# Patient Record
Sex: Male | Born: 1985
Health system: Southern US, Community
[De-identification: ages and names within clinical notes are randomized; demographics above are authoritative.]

## PROBLEM LIST (undated history)

## (undated) HISTORY — PX: HAND SURGERY: SHX662

---
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
## 2010-02-04 ENCOUNTER — Emergency Department (HOSPITAL_COMMUNITY): Admission: AC | Admit: 2010-02-04 | Discharge: 2010-02-04 | Payer: Self-pay | Admitting: Emergency Medicine

## 2010-02-15 ENCOUNTER — Emergency Department (HOSPITAL_COMMUNITY): Admission: EM | Admit: 2010-02-15 | Discharge: 2010-02-15 | Payer: Self-pay | Admitting: Emergency Medicine

## 2011-07-29 ENCOUNTER — Ambulatory Visit: Payer: Self-pay | Admitting: Family Medicine

## 2013-02-25 ENCOUNTER — Encounter (HOSPITAL_COMMUNITY): Payer: Self-pay

## 2013-02-25 ENCOUNTER — Emergency Department (HOSPITAL_COMMUNITY): Payer: Worker's Compensation

## 2013-02-25 ENCOUNTER — Emergency Department (HOSPITAL_COMMUNITY)
Admission: EM | Admit: 2013-02-25 | Discharge: 2013-02-25 | Disposition: A | Discharge status: Home / Self Care | Payer: Worker's Compensation | Attending: Emergency Medicine | Admitting: Emergency Medicine

## 2013-02-25 DIAGNOSIS — S0083XA Contusion of other part of head, initial encounter: Secondary | ICD-10-CM | POA: Insufficient documentation

## 2013-02-25 DIAGNOSIS — S0003XA Contusion of scalp, initial encounter: Secondary | ICD-10-CM | POA: Insufficient documentation

## 2013-02-25 DIAGNOSIS — H538 Other visual disturbances: Secondary | ICD-10-CM | POA: Insufficient documentation

## 2013-02-25 DIAGNOSIS — S0993XA Unspecified injury of face, initial encounter: Secondary | ICD-10-CM | POA: Insufficient documentation

## 2013-02-25 DIAGNOSIS — S0530XA Ocular laceration without prolapse or loss of intraocular tissue, unspecified eye, initial encounter: Secondary | ICD-10-CM | POA: Insufficient documentation

## 2013-02-25 MED ORDER — ACETAMINOPHEN 325 MG PO TABS: 650.0000 mg | ORAL_TABLET | Freq: Four times a day (QID) | ORAL | Status: AC | PRN
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
## 2013-02-27 NOTE — ED Provider Notes (Signed)
Medical screening examination/treatment/procedure(s) were performed by non-physician practitioner and as supervising physician I was immediately available for consultation/collaboration.   Enid Skeens, MD 02/27/13 [phone]
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
## 2015-10-27 ENCOUNTER — Emergency Department
Admission: EM | Admit: 2015-10-27 | Discharge: 2015-10-27 | Disposition: A | Discharge status: Home / Self Care | Payer: 59 | Attending: Emergency Medicine | Admitting: Emergency Medicine

## 2015-10-27 DIAGNOSIS — A084 Viral intestinal infection, unspecified: Secondary | ICD-10-CM | POA: Insufficient documentation

## 2015-10-27 DIAGNOSIS — R112 Nausea with vomiting, unspecified: Secondary | ICD-10-CM | POA: Diagnosis present

## 2015-10-27 LAB — COMPREHENSIVE METABOLIC PANEL
ALT: 26 U/L (ref 17–63)
ANION GAP: 9 (ref 5–15)
AST: 27 U/L (ref 15–41)
Albumin: 4.4 g/dL (ref 3.5–5.0)
Alkaline Phosphatase: 52 U/L (ref 38–126)
BUN: 20 mg/dL (ref 6–20)
CHLORIDE: 104 mmol/L (ref 101–111)
CO2: 25 mmol/L (ref 22–32)
Calcium: 9.2 mg/dL (ref 8.9–10.3)
Creatinine, Ser: 1.09 mg/dL (ref 0.61–1.24)
GFR calc Af Amer: >60 mL/min (ref >60)
Glucose, Bld: 154 mg/dL — ABNORMAL HIGH (ref 65–99)
POTASSIUM: 3.9 mmol/L (ref 3.5–5.1)
Sodium: 138 mmol/L (ref 135–145)
TOTAL PROTEIN: 7.7 g/dL (ref 6.5–8.1)
Total Bilirubin: 1.3 mg/dL — ABNORMAL HIGH (ref 0.3–1.2)

## 2015-10-27 LAB — CBC
HCT: 47.3 % (ref 40.0–52.0)
Hemoglobin: 16.7 g/dL (ref 13.0–18.0)
MCH: 28.6 pg (ref 26.0–34.0)
MCHC: 35.2 g/dL (ref 32.0–36.0)
MCV: 81.2 fL (ref 80.0–100.0)
PLATELETS: 189 10*3/uL (ref 150–440)
RBC: 5.82 MIL/uL (ref 4.40–5.90)
RDW: 12.9 % (ref 11.5–14.5)
WBC: 10.5 10*3/uL (ref 3.8–10.6)

## 2015-10-27 LAB — LIPASE, BLOOD: LIPASE: 19 U/L (ref 11–51)

## 2015-10-27 MED ORDER — LOPERAMIDE HCL 2 MG PO CAPS
4.0000 mg | ORAL_CAPSULE | Freq: Once | ORAL | Status: AC
  Administered 2015-10-27: 4 mg via ORAL
  Filled 2015-10-27: qty 2

## 2015-10-27 MED ORDER — SODIUM CHLORIDE 0.9 % IV BOLUS (SEPSIS)
1000.0000 mL | Freq: Once | INTRAVENOUS | Status: AC
  Administered 2015-10-27: 1000 mL via INTRAVENOUS

## 2015-10-27 MED ORDER — ONDANSETRON HCL 4 MG/2ML IJ SOLN
4.0000 mg | Freq: Once | INTRAMUSCULAR | Status: AC
  Administered 2015-10-27: 4 mg via INTRAVENOUS
  Filled 2015-10-27: qty 2

## 2015-10-27 MED ORDER — ONDANSETRON 4 MG PO TBDP: 4.0000 mg | ORAL_TABLET | Freq: Three times a day (TID) | ORAL | Status: DC | PRN

## 2015-10-27 NOTE — ED Provider Notes (Signed)
[facility] Emergency Department Provider Note  ____________________________________________  Time seen: 3:15 AM  I have reviewed the triage vital signs and the nursing notes.   HISTORY  Chief Complaint Emesis     HPI Stuart Anderson is a 30 y.o. male presents with history of acute onset of nausea vomiting and diarrhea associated with abdominal cramping at approximately 7 PM last night. Patient states that his wife has the same symptoms. Patient noted to be tachycardic on presentation with a heart rate of 139 and afebrile temperature of 98. Patient admits to copious episodes of nonbloody vomiting and diarrhea. Current discomfort is described as moderate     Past medical history None There are no active problems to display for this patient.   Past Surgical History  Procedure Laterality Date  . Hand surgery      Current Outpatient Rx  Name  Route  Sig  Dispense  Refill  . acetaminophen (TYLENOL) 325 MG tablet   Oral   Take 2 tablets (650 mg total) by mouth every 6 (six) hours as needed for pain.   15 tablet   0   . aspirin-acetaminophen-caffeine (EXCEDRIN MIGRAINE) 250-250-65 MG per tablet   Oral   Take 1 tablet by mouth every 6 (six) hours as needed for pain.           Allergies Review of patient's allergies indicates no known allergies.  No family history on file.  Social History Social History  Substance Use Topics  . Smoking status: Never Smoker   . Smokeless tobacco: Current User    Types: Chew  . Alcohol Use: Yes     Comment: occasinally    Review of Systems  Constitutional: Negative for fever. Eyes: Negative for visual changes. ENT: Negative for sore throat. Cardiovascular: Negative for chest pain. Respiratory: Negative for shortness of breath. Gastrointestinal: Positive for vomiting and diarrhea Genitourinary: Negative for dysuria. Musculoskeletal: Negative for back pain. Skin: Negative for rash. Neurological:  Negative for headaches, focal weakness or numbness.   10-point ROS otherwise negative.  ____________________________________________   PHYSICAL EXAM:  VITAL SIGNS: ED Triage Vitals  Enc Vitals Group     BP 10/27/15 0252 91/63 mmHg     Pulse Rate 10/27/15 0252 139     Resp 10/27/15 0252 16     Temp 10/27/15 0252 98 F (36.7 C)     Temp Source 10/27/15 0252 Oral     SpO2 10/27/15 0252 95 %     Weight 10/27/15 0252 220 lb (99.791 kg)     Height 10/27/15 0252  (1.854 m)     Head Cir --      Peak Flow --      Pain Score 10/27/15 0255 10     Pain Loc --      Pain Edu? --      Excl. in GC? --      Constitutional: Alert and oriented. Well appearing and in no distress. Eyes: Conjunctivae are normal. PERRL. Normal extraocular movements. ENT   Head: Normocephalic and atraumatic.   Nose: No congestion/rhinnorhea.   Mouth/Throat: Dry oral mucosa   Neck: No stridor. Hematological/Lymphatic/Immunilogical: No cervical lymphadenopathy. Cardiovascular: Normal rate, regular rhythm. Normal and symmetric distal pulses are present in all extremities. No murmurs, rubs, or gallops. Respiratory: Normal respiratory effort without tachypnea nor retractions. Breath sounds are clear and equal bilaterally. No wheezes/rales/rhonchi. Gastrointestinal: Soft and nontender. No distention. There is no CVA tenderness. Genitourinary: deferred Musculoskeletal: Nontender with normal range of motion  in all extremities. No joint effusions.  No lower extremity tenderness nor edema. Neurologic:  Normal speech and language. No gross focal neurologic deficits are appreciated. Speech is normal.  Skin:  Skin is warm, dry and intact. No rash noted. Psychiatric: Mood and affect are normal. Speech and behavior are normal. Patient exhibits appropriate insight and judgment.  ____________________________________________    LABS (pertinent positives/negatives)  Labs Reviewed  COMPREHENSIVE METABOLIC  PANEL - Abnormal; Notable for the following:    Glucose, Bld 154 (*)    Total Bilirubin 1.3 (*)    All other components within normal limits  LIPASE, BLOOD  CBC      INITIAL IMPRESSION / ASSESSMENT AND PLAN / ED COURSE  Pertinent labs & imaging results that were available during my care of the patient were reviewed by me and considered in my medical decision making (see chart for details).  Patient received 2 L IV normal saline, Zofran 4 mg IV as well as Imodium 4 mg by mouth with no further vomiting or diarrhea in the emergency department.  ____________________________________________   FINAL CLINICAL IMPRESSION(S) / ED DIAGNOSES  Final diagnoses:  Viral gastroenteritis      Darci Currentandolph N Amora Sheehy, MD 10/27/15 [phone]

## 2015-10-27 NOTE — Discharge Instructions (Signed)

## 2015-10-27 NOTE — ED Notes (Signed)
Pt in with co n.v.d and abd pain since yest  

## 2016-12-23 DIAGNOSIS — J301 Allergic rhinitis due to pollen: Secondary | ICD-10-CM | POA: Diagnosis not present

## 2017-03-23 DIAGNOSIS — M545 Low back pain: Secondary | ICD-10-CM | POA: Diagnosis not present

## 2017-10-03 DIAGNOSIS — J0100 Acute maxillary sinusitis, unspecified: Secondary | ICD-10-CM | POA: Diagnosis not present

## 2017-12-19 DIAGNOSIS — Z0000 Encounter for general adult medical examination without abnormal findings: Secondary | ICD-10-CM | POA: Diagnosis not present

## 2017-12-19 DIAGNOSIS — Z131 Encounter for screening for diabetes mellitus: Secondary | ICD-10-CM | POA: Diagnosis not present

## 2017-12-19 DIAGNOSIS — Z13220 Encounter for screening for lipoid disorders: Secondary | ICD-10-CM | POA: Diagnosis not present

## 2018-04-18 DIAGNOSIS — J069 Acute upper respiratory infection, unspecified: Secondary | ICD-10-CM | POA: Diagnosis not present

## 2018-04-18 DIAGNOSIS — J029 Acute pharyngitis, unspecified: Secondary | ICD-10-CM | POA: Diagnosis not present

## 2018-06-20 DIAGNOSIS — Z23 Encounter for immunization: Secondary | ICD-10-CM | POA: Diagnosis not present

## 2019-08-30 ENCOUNTER — Other Ambulatory Visit: Payer: 59

## 2023-01-19 ENCOUNTER — Other Ambulatory Visit: Payer: Self-pay

## 2023-01-19 ENCOUNTER — Ambulatory Visit (INDEPENDENT_AMBULATORY_CARE_PROVIDER_SITE_OTHER): Payer: No Typology Code available for payment source | Admitting: Allergy

## 2023-01-19 ENCOUNTER — Encounter: Payer: Self-pay | Admitting: Allergy

## 2023-01-19 VITALS — BP 110/70 | HR 81 | Temp 98.2°F | Ht 73.0 in | Wt 246.9 lb

## 2023-01-19 DIAGNOSIS — J302 Other seasonal allergic rhinitis: Secondary | ICD-10-CM

## 2023-01-19 DIAGNOSIS — J3089 Other allergic rhinitis: Secondary | ICD-10-CM | POA: Diagnosis not present

## 2023-01-19 MED ORDER — FEXOFENADINE HCL 180 MG PO TABS: 180.0000 mg | ORAL_TABLET | Freq: Every day | ORAL | 5 refills | Status: DC

## 2023-01-19 MED ORDER — FLUTICASONE PROPIONATE 50 MCG/ACT NA SUSP: 2.0000 | Freq: Every day | NASAL | 5 refills | Status: DC

## 2023-01-19 NOTE — Patient Instructions (Signed)
-   Testing today showed: grasses, weeds, trees, indoor molds, outdoor molds, dust mites, dog, and tobacco leaf - Copy of test results provided.  - Avoidance measures provided. - Stop taking: Zyrtec - Continue with: Flonase (fluticasone) 2 spray per nostril daily (AIM FOR EAR ON EACH SIDE) for 1-2 weeks at a time before stopping once nasal congestion improves for maximum benefit - Start taking: Allegra (fexofenadine) 180mg  tablet once daily - For the next 3-5 days use nasal Afrin 2 sprays 1-2 times a day.  Wait several minutes then when you can breathe more freely use your Flonase.  After 3-5 days the Flonase should be able to continue congestion control and stop use of Afrin.   - You can use an extra dose of the antihistamine, if needed, for breakthrough symptoms.  - Consider allergy shots as a means of long-term control. - Allergy shots "re-train" and "reset" the immune system to ignore environmental allergens and decrease the resulting immune response to those allergens (sneezing, itchy watery eyes, runny nose, nasal congestion, etc).    - Allergy shots improve symptoms in 75-85% of patients.   - There's 2 ways to start allergy shots traditional weekly injections for build-up vs RUSH protocol for build-up.  If interested in allergy shots you can let us know which start option you prefer.   Follow-up in 4-6 months or sooner if needed

## 2023-01-19 NOTE — Progress Notes (Signed)
New Patient Note  RE: Stuart Anderson MRN: [id number] DOB: 03-26-1986 Date of Office Visit: 01/19/2023  Primary care provider: Orvilla Cornwall, MD  Chief Complaint: allergies, congestion  History of present illness: Stuart Anderson is a 37 y.o. male presenting today for evaluation of allergic rhinitis.  He reports having congestion all the time.  When symptoms are bad can have drainage throat clearing.  Occasional itchy/watery eyes if he has been outside.   He can have sinus pressure/pain.   He has tried use of nasal saline flush but doesn't help him much.  He takes zyrtec daily for years and does help as it "tames" the symptoms.  He uses flonase at night to help him breathe better at night.  He states pollen season flares symptoms.   He does have a deviated septum and has seen an ENT in the past who recommended repair.   He has been to an allergist before several years ago and believes has had blood testing for allergens.  He states pollens were positive on testing that he recalls. No history of asthma, eczema or food allergy.  Review of systems: Review of Systems  Constitutional: Negative.   HENT:  Positive for congestion.   Eyes: Negative.   Respiratory: Negative.    Cardiovascular: Negative.   Musculoskeletal: Negative.   Skin: Negative.   Allergic/Immunologic: Negative.   Neurological: Negative.     All other systems negative unless noted above in HPI  Past medical history: History reviewed. No pertinent past medical history.  Past surgical history: Past Surgical History:  Procedure Laterality Date   HAND SURGERY      Family history:  Family History  Problem Relation Age of Onset   Allergic rhinitis Mother    Allergic rhinitis Father    Allergic rhinitis Brother     Social history: Lives in a home with out carpeting with electric heating and central cooling. Dog in the home. There is no concern for water damage, mildew or roaches in the home. He is a Veterinary surgeon.  He has no smoking history.    Medication List: Current Outpatient Medications  Medication Sig Dispense Refill   acetaminophen (TYLENOL) 325 MG tablet Take 2 tablets (650 mg total) by mouth every 6 (six) hours as needed for pain. 15 tablet 0   aspirin-acetaminophen-caffeine (EXCEDRIN MIGRAINE) 250-250-65 MG per tablet Take 1 tablet by mouth every 6 (six) hours as needed for pain.     atorvastatin (LIPITOR) 20 MG tablet Take by mouth.     cetirizine (ZYRTEC) 10 MG tablet Take 1 tablet by mouth daily.     escitalopram (LEXAPRO) 20 MG tablet Take 20 mg by mouth daily.     escitalopram (LEXAPRO) 20 MG tablet Take by mouth.     fexofenadine (ALLEGRA ALLERGY) 180 MG tablet Take 1 tablet (180 mg total) by mouth daily. 30 tablet 5   hydrOXYzine (ATARAX) 25 MG tablet Take by mouth.     ondansetron (ZOFRAN ODT) 4 MG disintegrating tablet Take 1 tablet (4 mg total) by mouth every 8 (eight) hours as needed for nausea or vomiting. 20 tablet 0   fluticasone (FLONASE) 50 MCG/ACT nasal spray Place 2 sprays into both nostrils daily. 16 g 5   No current facility-administered medications for this visit.    Known medication allergies: No Known Allergies   Physical examination: Blood pressure 110/70, pulse 81, temperature 98.2 F (36.8 C), height 6\' 1"  (1.854 m), weight 246 lb 14.4 oz (112 kg), SpO2 96 %.  General: Alert, interactive, in no acute distress. HEENT: PERRLA, TMs pearly gray, turbinates markedly edematous with clear discharge, post-pharynx non erythematous. Neck: Supple without lymphadenopathy. Lungs: Clear to auscultation without wheezing, rhonchi or rales. {no increased work of breathing. CV: Normal S1, S2 without murmurs. Abdomen: Nondistended, nontender. Skin: Warm and dry, without lesions or rashes. Extremities:  No clubbing, cyanosis or edema. Neuro:   Grossly intact.  Diagnositics/Labs:  Allergy testing:   Airborne Adult Perc - 01/19/23 0900     Time Antigen Placed 1610     Allergen Manufacturer Waynette Buttery    Location Back    Number of Test 55    1. Control-Buffer 50% Glycerol Negative    2. Control-Histamine 2+    3. Bahia 2+    4. [place] 2+    5. Johnson 2+    6. Kentucky Blue Negative    7. Meadow Fescue Negative    8. Perennial Rye 2+    9. Timothy Negative    10. Ragweed Mix Negative    11. Cocklebur Negative    12. Plantain,  English Negative    13. Baccharis 2+    14. Dog Fennel 2+    15. Guernsey Thistle 2+    16. Lamb's Quarters 2+    17. Sheep Sorrell 2+    18. Rough Pigweed 2+    19. Marsh Elder, Rough Negative    20. Mugwort, Common Negative    21. Box, Elder 2+    22. Cedar, red 2+    23. Sweet Gum 2+    24. Pecan Pollen 2+    25. Pine Mix 2+    26. Walnut, Black Pollen 2+    27. Red Mulberry 2+    28. Ash Mix 3+    29. Birch Mix 3+    30. Beech American 3+    31. Cottonwood, [place] 2+    32. Hickory, White 3+    33. Maple Mix 2+    34. Oak, [place] Mix 3+    35. Sycamore Eastern 2+    36. Alternaria Alternata 2+    37. Cladosporium Herbarum 2+    38. Aspergillus Mix 2+    39. Penicillium Mix 2+    40. Bipolaris Sorokiniana (Helminthosporium) 2+    41. Drechslera Spicifera (Curvularia) 2+    42. Mucor Plumbeus 2+    43. Fusarium Moniliforme 2+    44. Aureobasidium Pullulans (pullulara) 2+    45. Rhizopus Oryzae 2+    46. Botrytis Cinera 2+    47. Epicoccum Nigrum 2+    48. Phoma Betae Negative    49. Dust Mite Mix 2+    50. Cat Hair 10,000 BAU/ml Negative    51.  Dog Epithelia 2+    52. Mixed Feathers Negative    53. Horse Epithelia Negative    54. Cockroach, German Negative    55. Tobacco Leaf 2+             Allergy testing results were read and interpreted by provider, documented by clinical staff.   Assessment and plan: Allergic rhinitis  - Testing today showed: grasses, weeds, trees, indoor molds, outdoor molds, dust mites, dog, and tobacco leaf - Copy of test results provided.  - Avoidance measures  provided. - Stop taking: Zyrtec - Continue with: Flonase (fluticasone) 2 spray per nostril daily (AIM FOR EAR ON EACH SIDE) for 1-2 weeks at a time before stopping once nasal congestion improves for maximum benefit - Start taking: Allegra (fexofenadine)  180mg  tablet once daily - For the next 3-5 days use nasal Afrin 2 sprays 1-2 times a day.  Wait several minutes then when you can breathe more freely use your Flonase.  After 3-5 days the Flonase should be able to continue congestion control and stop use of Afrin.   - You can use an extra dose of the antihistamine, if needed, for breakthrough symptoms.  - Consider allergy shots as a means of long-term control. - Allergy shots "re-train" and "reset" the immune system to ignore environmental allergens and decrease the resulting immune response to those allergens (sneezing, itchy watery eyes, runny nose, nasal congestion, etc).    - Allergy shots improve symptoms in 75-85% of patients.   - There's 2 ways to start allergy shots traditional weekly injections for build-up vs RUSH protocol for build-up.  If interested in allergy shots you can let us know which start option you prefer.   Follow-up in 4-6 months or sooner if needed  I appreciate the opportunity to take part in Stuart Anderson's care. Please do not hesitate to contact me with questions.  Sincerely,   Margo Aye, MD Allergy/Immunology [facility]

## 2023-05-17 ENCOUNTER — Encounter: Payer: Self-pay | Admitting: Allergy

## 2023-05-17 ENCOUNTER — Ambulatory Visit (INDEPENDENT_AMBULATORY_CARE_PROVIDER_SITE_OTHER): Payer: No Typology Code available for payment source | Admitting: Allergy

## 2023-05-17 VITALS — BP 120/76 | HR 79 | Temp 98.0°F | Resp 18

## 2023-05-17 DIAGNOSIS — J3089 Other allergic rhinitis: Secondary | ICD-10-CM

## 2023-05-17 DIAGNOSIS — J302 Other seasonal allergic rhinitis: Secondary | ICD-10-CM

## 2023-05-17 DIAGNOSIS — J329 Chronic sinusitis, unspecified: Secondary | ICD-10-CM | POA: Diagnosis not present

## 2023-05-17 MED ORDER — XHANCE 93 MCG/ACT NA EXHU: 2.0000 | INHALANT_SUSPENSION | Freq: Two times a day (BID) | NASAL | 5 refills | Status: DC

## 2023-05-17 MED ORDER — CARBINOXAMINE MALEATE 4 MG PO TABS: ORAL_TABLET | ORAL | 5 refills | Status: AC

## 2023-05-17 NOTE — Progress Notes (Signed)
Follow-up Note  RE: Pascual Mantel MRN: [id number] DOB: [birth year] Date of Office Visit: 05/17/2023   History of present illness: Stuart Anderson is a 37 y.o. male presenting today for follow-up of allergic rhinitis.  He was last in the office on 01/19/2023 myself.  Discussed the use of AI scribe software for clinical note transcription with the patient, who gave verbal consent to proceed.  The patient, with a history of environmental allergies, presents with persistent nasal congestion and headaches. He reports no significant improvement in symptoms since the last visit in June, despite changes in his medication regimen. The patient has been taking Allegra as recommended but notes no discernible difference from previous use of Zyrtec. He also tried a combination of Afrin and Flonase to alleviate nasal congestion but reports no significant relief.  In addition to the allergy medications, the patient was also started on an anti-anxiety medication, Buspirone, by his primary care provider at the [place]. Since starting this medication, he has been experiencing headaches and a general feeling of being "off-kilter." He is unsure if these symptoms are related to the Buspirone or the allergy medications or allergy syptoms.  The patient expresses willingness to consider allergy shots as a potential treatment option, as his current regimen of medications does not seem to be effectively managing his symptoms.  The patient's nasal congestion is suspected to be contributing to his headaches, as his sinuses are likely unable to drain properly. Despite attempts to manage this with over-the-counter nasal sprays, the patient's nasal passages remain congested.       Review of systems: 10pt ROS negative unless noted above in HPI  Past medical/social/surgical/family history have been reviewed and are unchanged unless specifically indicated below.  No changes  Medication List: Current Outpatient Medications   Medication Sig Dispense Refill   acetaminophen (TYLENOL) 325 MG tablet Take 2 tablets (650 mg total) by mouth every 6 (six) hours as needed for pain. 15 tablet 0   aspirin-acetaminophen-caffeine (EXCEDRIN MIGRAINE) 250-250-65 MG per tablet Take 1 tablet by mouth every 6 (six) hours as needed for pain.     atorvastatin (LIPITOR) 20 MG tablet Take by mouth.     Carbinoxamine Maleate 4 MG TABS 1-2 tab twice a day 120 tablet 5   Fluticasone Propionate (XHANCE) 93 MCG/ACT EXHU Place 2 sprays into the nose 2 (two) times daily. 32 mL 5   No current facility-administered medications for this visit.     Known medication allergies: No Known Allergies   Physical examination: Blood pressure 120/76, pulse 79, temperature 98 F (36.7 C), temperature source Temporal, resp. rate 18, SpO2 98%.  General: Alert, interactive, in no acute distress. HEENT: PERRLA, TMs pearly gray, turbinates moderately edematous and erythematous with thick discharge, post-pharynx non erythematous. Neck: Supple without lymphadenopathy. Lungs: Clear to auscultation without wheezing, rhonchi or rales. {no increased work of breathing. CV: Normal S1, S2 without murmurs. Abdomen: Nondistended, nontender. Skin: Warm and dry, without lesions or rashes. Extremities:  No clubbing, cyanosis or edema. Neuro:   Grossly intact.  Diagnositics/Labs: None today  Assessment and plan: Chronic rhinosinusitis / allergic rhinitis - Continue avoidance measures for grasses, weeds, trees, indoor molds, outdoor molds, dust mites, dog, and tobacco leaf - Start taking: Carbinoxamine 4mg  twice a day - Xhance nasal spray 2 sprays each nostril twice a day for nasal congestion.  This device allows for deeper deposition of the medication to your sinus for better effect.   If unable to get coverage then would do similar  with nasal saline rinse with steroid added.   - Consider allergy shots as a means of long-term control. - Allergy shots "re-train"  and "reset" the immune system to ignore environmental allergens and decrease the resulting immune response to those allergens (sneezing, itchy watery eyes, runny nose, nasal congestion, etc).    - Allergy shots improve symptoms in 75-85% of patients.   - There's 2 ways to start allergy shots traditional weekly injections for build-up vs RUSH protocol for build-up.  If interested in allergy shots you can let us know which start option you prefer.   Follow-up in 4-6 months or sooner if needed   I appreciate the opportunity to take part in Angus's care. Please do not hesitate to contact me with questions.  Sincerely,   Margo Aye, MD Allergy/Immunology [facility]

## 2023-05-17 NOTE — Patient Instructions (Addendum)
-   Continue avoidance measures for grasses, weeds, trees, indoor molds, outdoor molds, dust mites, dog, and tobacco leaf - Start taking: Carbinoxamine 4mg  twice a day - Xhance nasal spray 2 sprays each nostril twice a day for nasal congestion.  This device allows for deeper deposition of the medication to your sinus for better effect.   If unable to get coverage then would do similar with nasal saline rinse with steroid added.   - Consider allergy shots as a means of long-term control. - Allergy shots "re-train" and "reset" the immune system to ignore environmental allergens and decrease the resulting immune response to those allergens (sneezing, itchy watery eyes, runny nose, nasal congestion, etc).    - Allergy shots improve symptoms in 75-85% of patients.   - There's 2 ways to start allergy shots traditional weekly injections for build-up vs RUSH protocol for build-up.  If interested in allergy shots you can let us know which start option you prefer.   Follow-up in 4-6 months or sooner if needed

## 2023-09-28 ENCOUNTER — Ambulatory Visit: Payer: No Typology Code available for payment source | Admitting: Allergy

## 2023-10-03 NOTE — Patient Instructions (Incomplete)
-   Continue avoidance measures for grasses, weeds, trees, indoor molds, outdoor molds, dust mites, dog, and tobacco leaf - Continue: Carbinoxamine 4mg  twice a day - Xhance nasal spray 2 sprays each nostril twice a day for nasal congestion.  This device allows for deeper deposition of the medication to your sinus for better effect.   If unable to get coverage then would do similar with nasal saline rinse with steroid added.   - Consider allergy shots as a means of long-term control. - Allergy shots "re-train" and "reset" the immune system to ignore environmental allergens and decrease the resulting immune response to those allergens (sneezing, itchy watery eyes, runny nose, nasal congestion, etc).    - Allergy shots improve symptoms in 75-85% of patients.   - There's 2 ways to start allergy shots traditional weekly injections for build-up vs RUSH protocol for build-up.  If interested in allergy shots you can let us know which start option you prefer.   Follow-up in  months or sooner if needed

## 2023-10-04 ENCOUNTER — Ambulatory Visit: Payer: No Typology Code available for payment source | Admitting: Family

## 2023-10-09 NOTE — Patient Instructions (Incomplete)
-   Continue avoidance measures for grasses, weeds, trees, indoor molds, outdoor molds, dust mites, dog, and tobacco leaf - Continue:fexofenadine once a day. [facility] about [facility] let us know and we can switch - fluticasone nasal spray 2 sprays each nostril once a day for nasal congestion.    - Consider allergy shots as a means of long-term control. - Allergy shots "re-train" and "reset" the immune system to ignore environmental allergens and decrease the resulting immune response to those allergens (sneezing, itchy watery eyes, runny nose, nasal congestion, etc).    - Allergy shots improve symptoms in 75-85% of patients.   - There's 2 ways to start allergy shots traditional weekly injections for build-up vs RUSH protocol for build-up.  If interested in allergy shots you can let us know which start option you prefer.   Follow-up in 6 months or sooner if needed

## 2023-10-10 ENCOUNTER — Encounter: Payer: Self-pay | Admitting: Family

## 2023-10-10 ENCOUNTER — Ambulatory Visit (INDEPENDENT_AMBULATORY_CARE_PROVIDER_SITE_OTHER): Payer: No Typology Code available for payment source | Admitting: Family

## 2023-10-10 ENCOUNTER — Other Ambulatory Visit: Payer: Self-pay

## 2023-10-10 VITALS — BP 126/72 | HR 96 | Resp 17

## 2023-10-10 DIAGNOSIS — J3089 Other allergic rhinitis: Secondary | ICD-10-CM | POA: Diagnosis not present

## 2023-10-10 DIAGNOSIS — J329 Chronic sinusitis, unspecified: Secondary | ICD-10-CM

## 2023-10-10 DIAGNOSIS — J302 Other seasonal allergic rhinitis: Secondary | ICD-10-CM | POA: Diagnosis not present

## 2023-10-10 NOTE — Progress Notes (Signed)
 [address] [place] [address] Dept: [phone]  FOLLOW UP NOTE  Patient ID: Stuart Anderson, male    DOB: 20-Mar-1986  Age: 38 y.o. MRN: [id number] Date of Office Visit: 10/10/2023  Assessment  Chief Complaint: Allergic Rhinitis   HPI Stuart Anderson is a 38 year old male who presents today for follow-up of chronic rhinosinusitis/allergic rhinitis.  He was last seen on May 17, 2023 by Dr. Delorse Lek.  He denies any new diagnosis or surgery since his last office visit.  He reports that his symptoms have not been too bad since it has been wintertime, but he is not certain how this spring will be.  He reports his symptoms are worse in the spring.  At times he will have rhinorrhea and nasal congestion.  His nasal congestion is worse at night.  He has not had a lot of postnasal drip.  He denies sinus pressure.  He only has a sore throat if he snores bad.  He has not been treated for any sinus infections since we last saw him.  He is currently taking fexofenadine once a day.  He is not certain if [facility] the [facility] that was prescribed at the last office visit.  He is also using fluticasone nasal spray as needed.  He is not certain that Stuart Anderson was covered by the [place].  He mentions that he lives with a dog, but it is a hypoallergenic golden doodle.   Drug Allergies:  No Known Allergies  Review of Systems: Negative except as per HPI   Physical Exam: BP 126/72 (BP Location: Left Arm, Patient Position: Sitting, Cuff Size: Large)   Pulse 96   Resp 17   SpO2 97%    Physical Exam Constitutional:      Appearance: Normal appearance.  HENT:     Head: Normocephalic and atraumatic.     Comments: Pharynx normal.  Eyes normal, ears normal, nose: deviated septum noted.  Bilateral lower turbinates mildly edematous with no drainage noted    Right Ear: Tympanic membrane, ear canal and external ear normal.     Left Ear: Tympanic membrane, ear canal and external ear normal.      Mouth/Throat:     Mouth: Mucous membranes are moist.     Pharynx: Oropharynx is clear.  Eyes:     Conjunctiva/sclera: Conjunctivae normal.  Neurological:     Mental Status: He is alert.     Diagnostics:  None  Assessment and Plan: 1. Seasonal and perennial allergic rhinitis   2. Chronic rhinosinusitis     No orders of the defined types were placed in this encounter.   Patient Instructions  - Continue avoidance measures for grasses, weeds, trees, indoor molds, outdoor molds, dust mites, dog, and tobacco leaf - Continue:fexofenadine once a day. After you [facility] about wether carbinoxamine is covered let us know and we can switch - fluticasone nasal spray 2 sprays each nostril once a day for nasal congestion.    - Consider allergy shots as a means of long-term control. - Allergy shots "re-train" and "reset" the immune system to ignore environmental allergens and decrease the resulting immune response to those allergens (sneezing, itchy watery eyes, runny nose, nasal congestion, etc).    - Allergy shots improve symptoms in 75-85% of patients.   - There's 2 ways to start allergy shots traditional weekly injections for build-up vs RUSH protocol for build-up.  If interested in allergy shots you can let us know which start option you prefer.  Follow-up in 6 months or sooner if needed  Return in about 6 months (around 04/08/2024), or if symptoms worsen or fail to improve.    Thank you for the opportunity to care for this patient.  Please do not hesitate to contact me with questions.  Nehemiah Settle, FNP [facility]

## 2023-11-06 ENCOUNTER — Telehealth: Payer: Self-pay | Admitting: Allergy

## 2023-11-06 NOTE — Telephone Encounter (Signed)
 Called and spoke with pt and informed them that a new authorization has been sent to the [place] and advised [facility] to [facility] for a [facility].

## 2024-04-10 ENCOUNTER — Ambulatory Visit: Payer: No Typology Code available for payment source | Admitting: Allergy
